# Patient Record
Sex: Female | Born: 1965 | Race: Black or African American | Hispanic: No | Marital: Married | Smoking: Never smoker
Health system: Southern US, Community
[De-identification: ages and names within clinical notes are randomized; demographics above are authoritative.]

## PROBLEM LIST (undated history)

## (undated) DIAGNOSIS — O021 Missed abortion: Secondary | ICD-10-CM

## (undated) DIAGNOSIS — Z8619 Personal history of other infectious and parasitic diseases: Secondary | ICD-10-CM

## (undated) HISTORY — PX: CHOLECYSTECTOMY: SHX55

## (undated) HISTORY — DX: Personal history of other infectious and parasitic diseases: Z86.19

## (undated) HISTORY — PX: CIRCUMCISION: SUR203

## (undated) HISTORY — DX: Missed abortion: O02.1

## (undated) HISTORY — PX: DILATION AND CURETTAGE OF UTERUS: SHX78

---

## 2009-03-16 DIAGNOSIS — Z8619 Personal history of other infectious and parasitic diseases: Secondary | ICD-10-CM

## 2009-03-16 HISTORY — DX: Personal history of other infectious and parasitic diseases: Z86.19

## 2009-06-21 ENCOUNTER — Emergency Department (HOSPITAL_BASED_OUTPATIENT_CLINIC_OR_DEPARTMENT_OTHER): Admission: EM | Admit: 2009-06-21 | Discharge: 2009-06-21 | Payer: Self-pay | Admitting: Emergency Medicine

## 2010-03-26 ENCOUNTER — Ambulatory Visit (HOSPITAL_COMMUNITY): Admission: RE | Admit: 2010-03-26 | Payer: 59 | Source: Home / Self Care | Admitting: Obstetrics and Gynecology

## 2010-04-16 ENCOUNTER — Ambulatory Visit (HOSPITAL_COMMUNITY)
Admission: RE | Admit: 2010-04-16 | Discharge: 2010-04-16 | Disposition: A | Discharge status: Home / Self Care | Payer: 59 | Source: Ambulatory Visit | Attending: Obstetrics and Gynecology | Admitting: Obstetrics and Gynecology

## 2010-04-16 ENCOUNTER — Other Ambulatory Visit: Payer: Self-pay | Admitting: Obstetrics and Gynecology

## 2010-04-16 DIAGNOSIS — N938 Other specified abnormal uterine and vaginal bleeding: Secondary | ICD-10-CM | POA: Insufficient documentation

## 2010-04-16 DIAGNOSIS — N949 Unspecified condition associated with female genital organs and menstrual cycle: Secondary | ICD-10-CM | POA: Insufficient documentation

## 2010-04-16 DIAGNOSIS — N840 Polyp of corpus uteri: Secondary | ICD-10-CM | POA: Insufficient documentation

## 2010-04-16 HISTORY — PX: HYSTEROSCOPY: SHX211

## 2010-04-16 LAB — CBC
Hemoglobin: 13.1 g/dL (ref 12.0–15.0)
MCH: 26.6 pg (ref 26.0–34.0)
MCHC: 32.3 g/dL (ref 30.0–36.0)
MCV: 82.5 fL (ref 78.0–100.0)
RBC: 4.92 MIL/uL (ref 3.87–5.11)

## 2010-04-29 NOTE — Op Note (Signed)
  NAMEDALARY, Veronica Morse            ACCOUNT NO.:  [id number]  MEDICAL RECORD NO.:  [id number]           PATIENT TYPE:  O  LOCATION:  [facility]                          [facility]  PHYSICIAN:  IT trainer, M.D.DATE OF BIRTH:  26-Sep-1965  DATE OF PROCEDURE:  04/16/2010 DATE OF DISCHARGE:                              OPERATIVE REPORT   PREOPERATIVE DIAGNOSES:  Dysfunctional uterine bleeding, endometrial mass,  endometrial thickness.  PROCEDURE: Diagnostic hysteroscopy, hysteroscopic resection of endometrial polyps, D and C.  POSTOPERATIVE DIAGNOSES:  Dysfunctional uterine bleeding and endometrial polyps.  ANESTHESIA:  General, paracervical block.  SURGEON:  Maxie Better, MD  ASSISTANT:  None.  PROCEDURE IN DETAIL:  Under adequate general anesthesia, the patient was placed in the dorsal lithotomy position.  She was sterilely prepped and draped in usual fashion.  The bladder was catheterized for moderate amount of urine.  Examination under anesthesia revealed anteverted uterus.  No adnexal masses could be appreciated.  A bivalve speculum placed in the vagina.  A 10 mL of 1% Nesacaine was injected paracervically at 3 and 9 o'clock position.  The anterior lip of the cervix was grasped with a single-tooth tenaculum.  Cervix serially dilated to #23 [facility] dilator.  A hysteroscope was introduced in the uterine cavity.  Both tubal ostia could be seen, left greater than right.  Multiple polypoid lesions were noted.  The hysteroscope was removed.  The polyp forceps was then introduced to attempt to remove the polyps in that fashion which was unsuccessful.  At that point, the cervix was further dilated and a resectoscope with a single loop was then placed.  The polypoid lesions were resected.  The resectoscope removed. Cavity was curetted.  Resectoscope inserted.  When all polypoid lesions was felt to be removed, the resectoscope was then removed.  The cavity was then  curetted.  All instruments then removed from the vagina. Specimen labeled endometrial curetting with presence of polyps was sent to Pathology.  Estimated blood loss was minimal.  Fluid deficit 100 mL. Sponge and instrument counts x2 was correct.  Complications none.  The patient tolerated the procedure well and was transferred to recovery in stable condition.     Maxie Better, M.D.     [place]/MEDQ  D:  04/16/2010  T:  04/17/2010  Job:  [id number]  Electronically Signed by Nena Jordan Bora Broner M.D. on 04/28/2010 02:14:55 AM

## 2010-05-11 LAB — COMPREHENSIVE METABOLIC PANEL
ALT: 348 U/L — ABNORMAL HIGH (ref 0–35)
AST: 715 U/L — ABNORMAL HIGH (ref 0–37)
Albumin: 4 g/dL (ref 3.5–5.2)
CO2: 25 mEq/L (ref 19–32)
Chloride: 107 mEq/L (ref 96–112)
Creatinine, Ser: 0.5 mg/dL (ref 0.4–1.2)
GFR calc Af Amer: >60 mL/min (ref >60)
GFR calc non Af Amer: >60 mL/min (ref >60)
Glucose, Bld: 140 mg/dL — ABNORMAL HIGH (ref 70–99)
Potassium: 4.1 mEq/L (ref 3.5–5.1)
Sodium: 140 mEq/L (ref 135–145)
Total Bilirubin: 0.8 mg/dL (ref 0.3–1.2)

## 2010-05-11 LAB — CBC
Hemoglobin: 13.4 g/dL (ref 12.0–15.0)
MCHC: 33 g/dL (ref 30.0–36.0)
MCV: 81.4 fL (ref 78.0–100.0)
Platelets: 193 10*3/uL (ref 150–400)
WBC: 12.8 10*3/uL — ABNORMAL HIGH (ref 4.0–10.5)

## 2010-05-11 LAB — URINE CULTURE

## 2010-05-11 LAB — URINALYSIS, ROUTINE W REFLEX MICROSCOPIC
Bilirubin Urine: NEGATIVE
Glucose, UA: NEGATIVE mg/dL
Specific Gravity, Urine: 1.025 (ref 1.005–1.030)
pH: 5.5 (ref 5.0–8.0)

## 2010-05-11 LAB — URINE MICROSCOPIC-ADD ON

## 2010-05-11 LAB — HEPATITIS PANEL, ACUTE: Hepatitis B Surface Ag: NEGATIVE

## 2010-05-13 ENCOUNTER — Other Ambulatory Visit: Payer: Self-pay | Admitting: Obstetrics and Gynecology

## 2010-06-29 ENCOUNTER — Other Ambulatory Visit: Payer: Self-pay | Admitting: Obstetrics and Gynecology

## 2010-07-08 ENCOUNTER — Other Ambulatory Visit: Payer: Self-pay | Admitting: Obstetrics and Gynecology

## 2011-01-04 ENCOUNTER — Other Ambulatory Visit (HOSPITAL_COMMUNITY): Payer: Self-pay | Admitting: Obstetrics and Gynecology

## 2011-01-04 DIAGNOSIS — N979 Female infertility, unspecified: Secondary | ICD-10-CM

## 2011-01-10 ENCOUNTER — Ambulatory Visit (HOSPITAL_COMMUNITY): Payer: 59

## 2011-01-11 ENCOUNTER — Ambulatory Visit (HOSPITAL_COMMUNITY)
Admission: RE | Admit: 2011-01-11 | Discharge: 2011-01-11 | Disposition: A | Discharge status: Home / Self Care | Payer: 59 | Source: Ambulatory Visit | Attending: Obstetrics and Gynecology | Admitting: Obstetrics and Gynecology

## 2011-01-11 DIAGNOSIS — N979 Female infertility, unspecified: Secondary | ICD-10-CM

## 2011-01-11 MED ORDER — IOHEXOL 300 MG/ML  SOLN: 7.0000 mL | Freq: Once | INTRAMUSCULAR | Status: AC | PRN

## 2011-10-03 ENCOUNTER — Ambulatory Visit (INDEPENDENT_AMBULATORY_CARE_PROVIDER_SITE_OTHER): Payer: 59 | Admitting: Gynecology

## 2011-10-03 ENCOUNTER — Encounter: Payer: Self-pay | Admitting: Gynecology

## 2011-10-03 VITALS — BP 120/84 | Ht 60.25 in | Wt 152.0 lb

## 2011-10-03 DIAGNOSIS — Z8742 Personal history of other diseases of the female genital tract: Secondary | ICD-10-CM

## 2011-10-03 DIAGNOSIS — N898 Other specified noninflammatory disorders of vagina: Secondary | ICD-10-CM

## 2011-10-03 DIAGNOSIS — N90810 Female genital mutilation status, unspecified: Secondary | ICD-10-CM

## 2011-10-03 DIAGNOSIS — Z8759 Personal history of other complications of pregnancy, childbirth and the puerperium: Secondary | ICD-10-CM

## 2011-10-03 DIAGNOSIS — Z113 Encounter for screening for infections with a predominantly sexual mode of transmission: Secondary | ICD-10-CM

## 2011-10-03 DIAGNOSIS — N760 Acute vaginitis: Secondary | ICD-10-CM

## 2011-10-03 DIAGNOSIS — A499 Bacterial infection, unspecified: Secondary | ICD-10-CM

## 2011-10-03 LAB — WET PREP FOR TRICH, YEAST, CLUE

## 2011-10-03 MED ORDER — METRONIDAZOLE 500 MG PO TABS: 500.0000 mg | ORAL_TABLET | Freq: Two times a day (BID) | ORAL | Status: AC

## 2011-10-03 NOTE — Progress Notes (Signed)
A 46 year old gravida 1 para 0 AB 1 who is from [place] who presented to the office today as a new patient. She did bring notes from her previous provider at  [facility] to help decipher the reason for her visit today. Patient had a missed AB and was recently treated with Cytotec 200 mcg x4 which she took her last dose on July 27. She is complaining of a vaginal discharge with odor and complaining of some bilateral lower  twinges in her lower abdomen. She had several quantitative beta-hCGs that I reviewed and the last one that was on August 2 had a value of 462. I did not see any documentation as to her blood type and Rh. Further review of her record indicated she had a normal Pap smear in January 2012 and prior to that she had regular cycles and had issues with infertility and had an HSG which had demonstrated bilateral tubal patency and also had a normal TSH and AMH levels which were normal. She did have history of a positive Chlamydia infection back in May of 2012. She also had the same year colposcopy secondary to post coital bleeding had several cervical biopsies which are reported to be normal. Also she had been 3 years on Crestor for hyperlipidemia and has been off of it for a year now. Patient with prior history of female circumcision.  Exam: Abdomen: Soft nontender no rebound or guarding Pelvic: Bartholin urethra Skene was within normal limits Vagina: Vaginal discharge with odor brownish in appearance Cervix: No gross lesions on inspection Bimanual exam: Uterus slightly anteverted upper limits of normal no rebound or guarding at Adnexa: No palpable masses or tenderness Rectal exam: Not done.  Assessment: Wet prep with evidence of bacterial vaginosis (positiveAmine, and moderate amount of tissue cells, and moderate amount of white blood cells, and to numerous to count bacteria)  Assessment/plan: Patient with apparent missed AB last month treated with Cytotec complaining of vaginal discharge  with odor dilution appearance. She will be treated for BV with Flagyl 500 mg twice a day for 5 days. We will draw quantitative beta-hCGs as well as a blood type and Rh today. She will return back to the office next week for an ultrasound to reassure that her intrauterine cavity had been completely evacuated and also for better assessment of her adnexa as a result of the twinges that she has been complaining of as well. A GC and Chlamydia culture was done today results pending at time of this dictation. Her has and was present all the above was discussed in detail all questions rancher will follow accordingly. We may need to have her have a fasting lipid profile in the future as well and is to sit down to discuss further her issues with infertility that she had before this pregnancy.

## 2011-10-03 NOTE — Patient Instructions (Signed)
Bacterial Vaginosis Bacterial vaginosis (BV) is a vaginal infection where the normal balance of bacteria in the vagina is disrupted. The normal balance is then replaced by an overgrowth of certain bacteria. There are several different kinds of bacteria that can cause BV. BV is the most common vaginal infection in women of childbearing age. CAUSES   The cause of BV is not fully understood. BV develops when there is an increase or imbalance of harmful bacteria.   Some activities or behaviors can upset the normal balance of bacteria in the vagina and put women at increased risk including:   Having a new sex partner or multiple sex partners.   Douching.   Using an intrauterine device (IUD) for contraception.   It is not clear what role sexual activity plays in the development of BV. However, women that have never had sexual intercourse are rarely infected with BV.  Women do not get BV from toilet seats, bedding, swimming pools or from touching objects around them.  SYMPTOMS   Grey vaginal discharge.   A fish-like odor with discharge, especially after sexual intercourse.   Itching or burning of the vagina and vulva.   Burning or pain with urination.   Some women have no signs or symptoms at all.  DIAGNOSIS  Your caregiver must examine the vagina for signs of BV. Your caregiver will perform lab tests and look at the sample of vaginal fluid through a microscope. They will look for bacteria and abnormal cells (clue cells), a pH test higher than 4.5, and a positive amine test all associated with BV.  RISKS AND COMPLICATIONS   Pelvic inflammatory disease (PID).   Infections following gynecology surgery.   Developing HIV.   Developing herpes virus.  TREATMENT  Sometimes BV will clear up without treatment. However, all women with symptoms of BV should be treated to avoid complications, especially if gynecology surgery is planned. Female partners generally do not need to be treated. However,  BV may spread between female sex partners so treatment is helpful in preventing a recurrence of BV.   BV may be treated with antibiotics. The antibiotics come in either pill or vaginal cream forms. Either can be used with nonpregnant or pregnant women, but the recommended dosages differ. These antibiotics are not harmful to the baby.   BV can recur after treatment. If this happens, a second round of antibiotics will often be prescribed.   Treatment is important for pregnant women. If not treated, BV can cause a premature delivery, especially for a pregnant woman who had a premature birth in the past. All pregnant women who have symptoms of BV should be checked and treated.   For chronic reoccurrence of BV, treatment with a type of prescribed gel vaginally twice a week is helpful.  HOME CARE INSTRUCTIONS   Finish all medication as directed by your caregiver.   Do not have sex until treatment is completed.   Tell your sexual partner that you have a vaginal infection. They should see their caregiver and be treated if they have problems, such as a mild rash or itching.   Practice safe sex. Use condoms. Only have 1 sex partner.  PREVENTION  Basic prevention steps can help reduce the risk of upsetting the natural balance of bacteria in the vagina and developing BV:  Do not have sexual intercourse (be abstinent).   Do not douche.   Use all of the medicine prescribed for treatment of BV, even if the signs and symptoms go away.     Tell your sex partner if you have BV. That way, they can be treated, if needed, to prevent reoccurrence.  SEEK MEDICAL CARE IF:   Your symptoms are not improving after 3 days of treatment.   You have increased discharge, pain, or fever.  MAKE SURE YOU:   Understand these instructions.   Will watch your condition.   Will get help right away if you are not doing well or get worse.  FOR MORE INFORMATION  Division of STD Prevention (DSTDP), Centers for Disease  Control and Prevention: www.cdc.gov/std American Social Health Association (ASHA): www.ashastd.org  Document Released: 02/07/2005 Document Revised: 01/27/2011 Document Reviewed: 07/31/2008 ExitCare Patient Information 2012 [employer]. 

## 2011-10-04 LAB — GC/CHLAMYDIA PROBE AMP, GENITAL: GC Probe Amp, Genital: NEGATIVE

## 2011-10-04 LAB — ABO AND RH

## 2011-10-13 ENCOUNTER — Ambulatory Visit (INDEPENDENT_AMBULATORY_CARE_PROVIDER_SITE_OTHER): Payer: 59

## 2011-10-13 ENCOUNTER — Ambulatory Visit (INDEPENDENT_AMBULATORY_CARE_PROVIDER_SITE_OTHER): Payer: 59 | Admitting: Gynecology

## 2011-10-13 ENCOUNTER — Encounter: Payer: Self-pay | Admitting: Gynecology

## 2011-10-13 DIAGNOSIS — D259 Leiomyoma of uterus, unspecified: Secondary | ICD-10-CM

## 2011-10-13 DIAGNOSIS — Z8742 Personal history of other diseases of the female genital tract: Secondary | ICD-10-CM

## 2011-10-13 DIAGNOSIS — D252 Subserosal leiomyoma of uterus: Secondary | ICD-10-CM

## 2011-10-13 DIAGNOSIS — N839 Noninflammatory disorder of ovary, fallopian tube and broad ligament, unspecified: Secondary | ICD-10-CM

## 2011-10-13 DIAGNOSIS — D251 Intramural leiomyoma of uterus: Secondary | ICD-10-CM

## 2011-10-13 DIAGNOSIS — N83209 Unspecified ovarian cyst, unspecified side: Secondary | ICD-10-CM

## 2011-10-13 DIAGNOSIS — N949 Unspecified condition associated with female genital organs and menstrual cycle: Secondary | ICD-10-CM

## 2011-10-13 DIAGNOSIS — O021 Missed abortion: Secondary | ICD-10-CM

## 2011-10-13 DIAGNOSIS — O26899 Other specified pregnancy related conditions, unspecified trimester: Secondary | ICD-10-CM

## 2011-10-13 DIAGNOSIS — Z8759 Personal history of other complications of pregnancy, childbirth and the puerperium: Secondary | ICD-10-CM

## 2011-10-13 NOTE — Patient Instructions (Addendum)
Ovarian Cyst  The ovaries are small organs that are on each side of the uterus. The ovaries are the organs that produce the female hormones, estrogen and progesterone. An ovarian cyst is a sac filled with fluid that can vary in its size. It is normal for a small cyst to form in women who are in the childbearing age and who have menstrual periods. This type of cyst is called a follicle cyst that becomes an ovulation cyst (corpus luteum cyst) after it produces the women's egg. It later goes away on its own if the woman does not become pregnant. There are other kinds of ovarian cysts that may cause problems and may need to be treated. The most serious problem is a cyst with cancer. It should be noted that menopausal women who have an ovarian cyst are at a higher risk of it being a cancer cyst. They should be evaluated very quickly, thoroughly and followed closely. This is especially true in menopausal women because of the high rate of ovarian cancer in women in menopause.  CAUSES AND TYPES OF OVARIAN CYSTS:   FUNCTIONAL CYST: The follicle/corpus luteum cyst is a functional cyst that occurs every month during ovulation with the menstrual cycle. They go away with the next menstrual cycle if the woman does not get pregnant. Usually, there are no symptoms with a functional cyst.   ENDOMETRIOMA CYST: This cyst develops from the lining of the uterus tissue. This cyst gets in or on the ovary. It grows every month from the bleeding during the menstrual period. It is also called a "chocolate cyst" because it becomes filled with blood that turns brown. This cyst can cause pain in the lower abdomen during intercourse and with your menstrual period.   CYSTADENOMA CYST: This cyst develops from the cells on the outside of the ovary. They usually are not cancerous. They can get very big and cause lower abdomen pain and pain with intercourse. This type of cyst can twist on itself, cut off its blood supply and cause severe pain. It  also can easily rupture and cause a lot of pain.   DERMOID CYST: This type of cyst is sometimes found in both ovaries. They are found to have different kinds of body tissue in the cyst. The tissue includes skin, teeth, hair, and/or cartilage. They usually do not have symptoms unless they get very big. Dermoid cysts are rarely cancerous.   POLYCYSTIC OVARY: This is a rare condition with hormone problems that produces many small cysts on both ovaries. The cysts are follicle-like cysts that never produce an egg and become a corpus luteum. It can cause an increase in body weight, infertility, acne, increase in body and facial hair and lack of menstrual periods or rare menstrual periods. Many women with this problem develop type 2 diabetes. The exact cause of this problem is unknown. A polycystic ovary is rarely cancerous.   THECA LUTEIN CYST: Occurs when too much hormone (human chorionic gonadotropin) is produced and over-stimulates the ovaries to produce an egg. They are frequently seen when doctors stimulate the ovaries for invitro-fertilization (test tube babies).   LUTEOMA CYST: This cyst is seen during pregnancy. Rarely it can cause an obstruction to the birth canal during labor and delivery. They usually go away after delivery.  SYMPTOMS    Pelvic pain or pressure.   Pain during sexual intercourse.   Increasing girth (swelling) of the abdomen.   Abnormal menstrual periods.   Increasing pain with menstrual periods.     You stop having menstrual periods and you are not pregnant.  DIAGNOSIS   The diagnosis can be made during:   Routine or annual pelvic examination (common).   Ultrasound.   X-ray of the pelvis.   CT Scan.   MRI.   Blood tests.  TREATMENT    Treatment may only be to follow the cyst monthly for 2 to 3 months with your caregiver. Many go away on their own, especially functional cysts.   May be aspirated (drained) with a long needle with ultrasound, or by laparoscopy (inserting a tube into  the pelvis through a small incision).   The whole cyst can be removed by laparoscopy.   Sometimes the cyst may need to be removed through an incision in the lower abdomen.   Hormone treatment is sometimes used to help dissolve certain cysts.   Birth control pills are sometimes used to help dissolve certain cysts.  HOME CARE INSTRUCTIONS   Follow your caregiver's advice regarding:   Medicine.   Follow up visits to evaluate and treat the cyst.   You may need to come back or make an appointment with another caregiver, to find the exact cause of your cyst, if your caregiver is not a gynecologist.   Get your yearly and recommended pelvic examinations and Pap tests.   Let your caregiver know if you have had an ovarian cyst in the past.  SEEK MEDICAL CARE IF:    Your periods are late, irregular, they stop, or are painful.   Your stomach (abdomen) or pelvic pain does not go away.   Your stomach becomes larger or swollen.   You have pressure on your bladder or trouble emptying your bladder completely.   You have painful sexual intercourse.   You have feelings of fullness, pressure, or discomfort in your stomach.   You lose weight for no apparent reason.   You feel generally ill.   You become constipated.   You lose your appetite.   You develop acne.   You have an increase in body and facial hair.   You are gaining weight, without changing your exercise and eating habits.   You think you are pregnant.  SEEK IMMEDIATE MEDICAL CARE IF:    You have increasing abdominal pain.   You feel sick to your stomach (nausea) and/or vomit.   You develop a fever that comes on suddenly.   You develop abdominal pain during a bowel movement.   Your menstrual periods become heavier than usual.  Document Released: 02/07/2005 Document Revised: 01/27/2011 Document Reviewed: 12/11/2008  ExitCare Patient Information 2012 [employer].    Fibroids  You have been diagnosed as having a fibroid. Fibroids are smooth muscle  lumps (tumors) which can occur any place in a woman's body. They are usually in the womb (uterus). The most common problem (symptom) of fibroids is bleeding. Over time this may cause low red blood cells (anemia). Other symptoms include feelings of pressure and pain in the pelvis. The diagnosis (learning what is wrong) of fibroids is made by physical exam. Sometimes tests such as an ultrasound are used. This is helpful when fibroids are felt around the ovaries and to look for tumors.  TREATMENT    Most fibroids do not need surgical or medical treatment. Sometimes a tissue sample (biopsy) of the lining of the uterus is done to rule out cancer. If there is no cancer and only a small amount of bleeding, the problem can be watched.   Hormonal treatment can   improve the problem.   When surgery is needed, it can consist of removing the fibroid. Vaginal birth may not be possible after the removal of fibroids. This depends on where they are and the extent of surgery. When pregnancy occurs with fibroids it is usually normal.   Your caregiver can help decide which treatments are best for you.  HOME CARE INSTRUCTIONS    Do not use aspirin as this may increase bleeding problems.   If your periods (menses) are heavy, record the number of pads or tampons used per month. Bring this information to your caregiver. This can help them determine the best treatment for you.  SEEK IMMEDIATE MEDICAL CARE IF:   You have pelvic pain or cramps not controlled with medications, or experience a sudden increase in pain.   You have an increase of pelvic bleeding between and during menses.   You feel lightheaded or have fainting spells.   You develop worsening belly (abdominal) pain.  Document Released: 02/05/2000 Document Revised: 01/27/2011 Document Reviewed: 09/27/2007  ExitCare Patient Information 2012 [employer].

## 2011-10-13 NOTE — Progress Notes (Signed)
A 46 year old gravida 1 para 0 AB 1 who is from [place] who was seen in the office last week as a new patient. She did bring notes from her previous provider at [facility] to help decipher the reason for her visit today. Patient had a missed AB and was recently treated with Cytotec 200 mcg x4 which she took her last dose on July 27. She is complaining of a vaginal discharge with odor and complaining of some bilateral lower twinges in her lower abdomen. She had several quantitative beta-hCGs that I reviewed and the last one that was on August 2 had a value of 462. I did not see any documentation as to her blood type and Rh. Further review of her record indicated she had a normal Pap smear in January 2012 and prior to that she had regular cycles and had issues with infertility and had an HSG which had demonstrated bilateral tubal patency and also had a normal TSH and AMH levels which were normal. She did have history of a positive Chlamydia infection back in May of 2012. She also had the same year colposcopy secondary to post coital bleeding had several cervical biopsies which are reported to be normal. Also she had been 3 years on Crestor for hyperlipidemia and has been off of it for a year now. Patient with prior history of female circumcision.    Patient with apparent missed AB last month treated with Cytotec. Patient was asked to return to the office today for a blood type and Rh as well as an ultrasound. Her blood type is B+ her recent GC and Chlamydia culture were negative. On August 2 her quantitative beta-hCG was 462. She had been having some lower abdominal twinges and now is the reason for an ultrasound today as well as to make sure that there was no retained products of conception.  Ultrasound today: Uterus measured 9.2 x 6.4 x 5.1 cm endometrial stripe of 14.1 mm. Right intramural and subserous fibroids measuring 23 x 22 mm, 26 x 26 mm. Prominent endometrial cavity unable to identify any retained  products of conception. A right ovarian thinwall cyst was noted and measured 4.7 x 4.6 x 4.3 cm reticular echo pattern and avascular left ovary was normal. Minimal fluid in the cul-de-sac.  Assessment/plan: Patient status post missed AB recently treated with Cytotec. We'll check quantitative beta-hCG to monitored trend. Patient will be instructed to use barrier contraception for the next 3 months. She will return back in 3 months for followup ultrasound to ascertain complete resolution of this ovarian cyst. Will notify with the results of the quantitative beta-hCG and manage accordingly.

## 2011-10-14 ENCOUNTER — Other Ambulatory Visit: Payer: Self-pay | Admitting: Gynecology

## 2011-10-14 DIAGNOSIS — O021 Missed abortion: Secondary | ICD-10-CM

## 2011-10-18 ENCOUNTER — Other Ambulatory Visit: Payer: 59

## 2011-10-20 ENCOUNTER — Other Ambulatory Visit: Payer: 59

## 2011-10-20 DIAGNOSIS — O021 Missed abortion: Secondary | ICD-10-CM

## 2011-10-21 ENCOUNTER — Other Ambulatory Visit: Payer: Self-pay | Admitting: Gynecology

## 2011-10-21 DIAGNOSIS — O021 Missed abortion: Secondary | ICD-10-CM

## 2011-10-21 DIAGNOSIS — N83209 Unspecified ovarian cyst, unspecified side: Secondary | ICD-10-CM

## 2011-10-31 ENCOUNTER — Other Ambulatory Visit: Payer: 59

## 2011-10-31 DIAGNOSIS — O021 Missed abortion: Secondary | ICD-10-CM

## 2012-01-12 ENCOUNTER — Ambulatory Visit (INDEPENDENT_AMBULATORY_CARE_PROVIDER_SITE_OTHER): Payer: 59

## 2012-01-12 ENCOUNTER — Ambulatory Visit (INDEPENDENT_AMBULATORY_CARE_PROVIDER_SITE_OTHER): Payer: 59 | Admitting: Gynecology

## 2012-01-12 ENCOUNTER — Encounter: Payer: Self-pay | Admitting: Gynecology

## 2012-01-12 VITALS — BP 126/80

## 2012-01-12 DIAGNOSIS — N83209 Unspecified ovarian cyst, unspecified side: Secondary | ICD-10-CM

## 2012-01-12 DIAGNOSIS — N949 Unspecified condition associated with female genital organs and menstrual cycle: Secondary | ICD-10-CM

## 2012-01-12 DIAGNOSIS — R102 Pelvic and perineal pain: Secondary | ICD-10-CM

## 2012-01-12 DIAGNOSIS — D252 Subserosal leiomyoma of uterus: Secondary | ICD-10-CM

## 2012-01-12 DIAGNOSIS — D251 Intramural leiomyoma of uterus: Secondary | ICD-10-CM

## 2012-01-12 DIAGNOSIS — N831 Corpus luteum cyst of ovary, unspecified side: Secondary | ICD-10-CM

## 2012-01-12 DIAGNOSIS — D259 Leiomyoma of uterus, unspecified: Secondary | ICD-10-CM

## 2012-01-12 DIAGNOSIS — N83201 Unspecified ovarian cyst, right side: Secondary | ICD-10-CM

## 2012-01-12 NOTE — Progress Notes (Signed)
Patient presented to the office today for an ultrasound to followup on a right ovarian cyst seen on ultrasound August 2013 see previous note dated August 22 for detail past medical history.  Patient asymptomatic today. Ultrasound demonstrated a uterus measured 9.5 x 6.5 x 5.3 cm endometrial stripe of 12.6 mm (patient currently on day 20 of her cycle). Patient with subserosal fibroma measuring 29 x 23 mm and a second one measuring 21 x 21 mm. Right ovary was normal. Previous right ovarian cyst not seen. Left ovary thick wall cyst measuring 25 x 24 x 27 mm with positive color flow in the periphery consistent with a corpus luteum cyst. Moderate amount of fluid was seen in the cul-de-sac.  Patient had a miscarriage in July treated with Cytotec and spontaneously aborted. Quantitative beta-hCGs were followed down to 0. See previous note. Patient interested in getting pregnant. She has been using barrier contraception. We discussed the utilization of ovulation predictor kit to time her intercourse. I recommend she start prenatal vitamins. She will return back to the office in March of next year for her complete annual gynecological examination and ultrasound for followup on the small left ovarian cyst.

## 2012-01-27 ENCOUNTER — Other Ambulatory Visit: Payer: 59

## 2012-01-27 ENCOUNTER — Ambulatory Visit: Payer: 59 | Admitting: Gynecology

## 2012-05-18 ENCOUNTER — Encounter: Payer: 59 | Admitting: Gynecology

## 2012-05-18 ENCOUNTER — Other Ambulatory Visit: Payer: 59

## 2012-05-23 ENCOUNTER — Other Ambulatory Visit: Payer: Self-pay | Admitting: Gynecology

## 2012-05-23 DIAGNOSIS — N83202 Unspecified ovarian cyst, left side: Secondary | ICD-10-CM

## 2012-05-25 ENCOUNTER — Ambulatory Visit (INDEPENDENT_AMBULATORY_CARE_PROVIDER_SITE_OTHER): Payer: 59 | Admitting: Gynecology

## 2012-05-25 ENCOUNTER — Ambulatory Visit (INDEPENDENT_AMBULATORY_CARE_PROVIDER_SITE_OTHER): Payer: 59

## 2012-05-25 DIAGNOSIS — D219 Benign neoplasm of connective and other soft tissue, unspecified: Secondary | ICD-10-CM

## 2012-05-25 DIAGNOSIS — D259 Leiomyoma of uterus, unspecified: Secondary | ICD-10-CM

## 2012-05-25 DIAGNOSIS — N83209 Unspecified ovarian cyst, unspecified side: Secondary | ICD-10-CM

## 2012-05-25 DIAGNOSIS — N83202 Unspecified ovarian cyst, left side: Secondary | ICD-10-CM

## 2012-05-25 NOTE — Patient Instructions (Addendum)

## 2012-05-25 NOTE — Progress Notes (Signed)
Patient presented today for followup ultrasound for followup of a previously seen left ovarian thick wall cyst that measured 25 x 24 x 27 mm on 01/12/2012. Review records as follows:  Patient had a miscarriage in July treated with Cytotec and spontaneously aborted. Quantitative beta-hCGs were followed down to 0. See previous note. Patient interested in getting pregnant.   She's currently not using any form of contraception it was recommended before that she start prenatal vitamins. She's having normal menstrual cycles and is asymptomatic today.  Ultrasound today: Uterus measured 10 x 7.1 x 5.6 cm with endometrial stripe 11.5 mm, patient with 3 small fibroids the largest one measured 20 x 25 x 21 mm. Both ovaries appeared to be normal.  Assessment/plan: Patient was reassured that the previously seen ovarian cyst has resolved. We discussed once again and gave her written explanation on the timing of intercourse by using the ovulation predictor kit. She was once again given prescription for prenatal vitamins. She will return back next month and she is due for her annual exam.

## 2012-06-29 ENCOUNTER — Encounter: Payer: 59 | Admitting: Gynecology

## 2013-05-29 ENCOUNTER — Encounter: Payer: 59 | Admitting: Gynecology

## 2013-10-30 ENCOUNTER — Encounter: Payer: Self-pay | Admitting: Women's Health

## 2013-10-30 ENCOUNTER — Ambulatory Visit (INDEPENDENT_AMBULATORY_CARE_PROVIDER_SITE_OTHER): Payer: 59 | Admitting: Women's Health

## 2013-10-30 DIAGNOSIS — A499 Bacterial infection, unspecified: Secondary | ICD-10-CM

## 2013-10-30 DIAGNOSIS — N926 Irregular menstruation, unspecified: Secondary | ICD-10-CM

## 2013-10-30 DIAGNOSIS — N760 Acute vaginitis: Secondary | ICD-10-CM

## 2013-10-30 DIAGNOSIS — B9689 Other specified bacterial agents as the cause of diseases classified elsewhere: Secondary | ICD-10-CM

## 2013-10-30 DIAGNOSIS — N979 Female infertility, unspecified: Secondary | ICD-10-CM | POA: Insufficient documentation

## 2013-10-30 LAB — WET PREP FOR TRICH, YEAST, CLUE
Trich, Wet Prep: NONE SEEN
Yeast Wet Prep HPF POC: NONE SEEN

## 2013-10-30 LAB — TSH: TSH: 2.565 u[IU]/mL (ref 0.350–4.500)

## 2013-10-30 MED ORDER — METRONIDAZOLE 0.75 % VA GEL: VAGINAL | Status: AC

## 2013-10-30 NOTE — Patient Instructions (Signed)
Bacterial Vaginosis Bacterial vaginosis is an infection of the vagina. It happens when too many of certain germs (bacteria) grow in the vagina. HOME CARE  Take your medicine as told by your doctor.  Finish your medicine even if you start to feel better.  Do not have sex until you finish your medicine and are better.  Tell your sex partner that you have an infection. They should see their doctor for treatment.  Practice safe sex. Use condoms. Have only one sex partner. GET HELP IF:  You are not getting better after 3 days of treatment.  You have more grey fluid (discharge) coming from your vagina than before.  You have more pain than before.  You have a fever. MAKE SURE YOU:   Understand these instructions.  Will watch your condition.  Will get help right away if you are not doing well or get worse. Document Released: 11/17/2007 Document Revised: 11/28/2012 Document Reviewed: 09/19/2012 ExitCare Patient Information 2015 [employer]. This information is not intended to replace advice given to you by your health care provider. Make sure you discuss any questions you have with your health care provider.  

## 2013-10-30 NOTE — Progress Notes (Signed)
Patient ID: Veronica Morse, female   DOB: 10/03/1965, 48 y.o.   MRN: [id number] Presents with several issues. Reports having irregular normal monthly cycle that has had bleeding after intercourse the past 3-4 months, will have no bleeding for several days and then will have bright red bleeding. Has had some intermittent left lower quadrant discomfort. Denies urinary symptoms, fever. Desires pregnancy, has had one SAB 08/2011. Has been married greater than 10 years with no contraception. Husband has had a semen analysis that showed minimally low motility and count. Originally from [place], [place] requested several labs which we will check today. Negative GC/Chlamydia 2013.  Exam: Appears well. 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 Bimanual no CMT or adnexal fullness or tenderness.  Bacteria vaginosis DUB  Plan: MetroGel vaginal cream 1 applicator at bedtime x5, alcohol for cautions reviewed. If continued irregular bleeding instructed to return to office. TSH, prolactin. Requested labs CMV, toxoplasmosis, cardiolipins,  Lupus, RPR. Declines referral to infertility. Instructed to schedule appointment Dr. Toney Rakes to remove large skin tag on left labia.

## 2013-10-31 LAB — PROLACTIN: PROLACTIN: 8.1 ng/mL

## 2013-10-31 LAB — CARDIOLIPIN ANTIBODIES, IGG, IGM, IGA
ANTICARDIOLIPIN IGA: 8 U/mL (ref <22)
ANTICARDIOLIPIN IGG: 10 GPL U/mL (ref <23)
ANTICARDIOLIPIN IGM: 4 [MPL'U]/mL (ref <11)

## 2013-10-31 LAB — CMV IGM: CMV IGM: 10.6 [AU]/ml (ref <30.00)

## 2013-10-31 LAB — CYTOMEGALOVIRUS ANTIBODY, IGG: Cytomegalovirus Ab-IgG: 8 U/mL — ABNORMAL HIGH (ref <0.60)

## 2013-10-31 LAB — TOXOPLASMA GONDII ANTIBODY, IGM

## 2013-10-31 LAB — RPR

## 2013-11-01 LAB — LUPUS ANTICOAGULANT PANEL
DRVVT: 32.7 secs (ref <42.9)
LUPUS ANTICOAGULANT: NOT DETECTED
PTT Lupus Anticoagulant: 31.6 secs (ref 28.0–43.0)

## 2013-12-23 ENCOUNTER — Encounter: Payer: Self-pay | Admitting: Women's Health

## 2013-12-23 ENCOUNTER — Encounter: Payer: 59 | Admitting: Gynecology

## 2013-12-25 ENCOUNTER — Other Ambulatory Visit: Payer: Self-pay

## 2013-12-25 ENCOUNTER — Encounter: Payer: Self-pay | Admitting: Gynecology

## 2013-12-25 ENCOUNTER — Ambulatory Visit (INDEPENDENT_AMBULATORY_CARE_PROVIDER_SITE_OTHER): Payer: 59 | Admitting: Gynecology

## 2013-12-25 ENCOUNTER — Other Ambulatory Visit (HOSPITAL_COMMUNITY)
Admission: RE | Admit: 2013-12-25 | Discharge: 2013-12-25 | Disposition: A | Discharge status: Home / Self Care | Payer: 59 | Source: Ambulatory Visit | Attending: Gynecology | Admitting: Gynecology

## 2013-12-25 VITALS — BP 126/78 | Ht 61.5 in | Wt 139.0 lb

## 2013-12-25 DIAGNOSIS — A499 Bacterial infection, unspecified: Secondary | ICD-10-CM

## 2013-12-25 DIAGNOSIS — D251 Intramural leiomyoma of uterus: Secondary | ICD-10-CM | POA: Insufficient documentation

## 2013-12-25 DIAGNOSIS — Z113 Encounter for screening for infections with a predominantly sexual mode of transmission: Secondary | ICD-10-CM

## 2013-12-25 DIAGNOSIS — N898 Other specified noninflammatory disorders of vagina: Secondary | ICD-10-CM

## 2013-12-25 DIAGNOSIS — N920 Excessive and frequent menstruation with regular cycle: Secondary | ICD-10-CM | POA: Insufficient documentation

## 2013-12-25 DIAGNOSIS — B9689 Other specified bacterial agents as the cause of diseases classified elsewhere: Secondary | ICD-10-CM

## 2013-12-25 DIAGNOSIS — Z1151 Encounter for screening for human papillomavirus (HPV): Secondary | ICD-10-CM | POA: Insufficient documentation

## 2013-12-25 DIAGNOSIS — Z01419 Encounter for gynecological examination (general) (routine) without abnormal findings: Secondary | ICD-10-CM | POA: Insufficient documentation

## 2013-12-25 DIAGNOSIS — N760 Acute vaginitis: Secondary | ICD-10-CM

## 2013-12-25 DIAGNOSIS — Z1231 Encounter for screening mammogram for malignant neoplasm of breast: Secondary | ICD-10-CM

## 2013-12-25 DIAGNOSIS — N907 Vulvar cyst: Secondary | ICD-10-CM

## 2013-12-25 DIAGNOSIS — N9089 Other specified noninflammatory disorders of vulva and perineum: Secondary | ICD-10-CM

## 2013-12-25 LAB — CHOLESTEROL, TOTAL: CHOLESTEROL: 208 mg/dL — AB (ref 0–200)

## 2013-12-25 LAB — COMPREHENSIVE METABOLIC PANEL
ALBUMIN: 4.1 g/dL (ref 3.5–5.2)
ALT: 14 U/L (ref 0–35)
AST: 17 U/L (ref 0–37)
Alkaline Phosphatase: 72 U/L (ref 39–117)
BUN: 6 mg/dL (ref 6–23)
CALCIUM: 9.3 mg/dL (ref 8.4–10.5)
CHLORIDE: 104 meq/L (ref 96–112)
CO2: 24 mEq/L (ref 19–32)
Creat: 0.49 mg/dL — ABNORMAL LOW (ref 0.50–1.10)
Glucose, Bld: 82 mg/dL (ref 70–99)
POTASSIUM: 4.3 meq/L (ref 3.5–5.3)
SODIUM: 140 meq/L (ref 135–145)
TOTAL PROTEIN: 6.8 g/dL (ref 6.0–8.3)
Total Bilirubin: 0.4 mg/dL (ref 0.2–1.2)

## 2013-12-25 LAB — CBC WITH DIFFERENTIAL/PLATELET
Basophils Absolute: 0 10*3/uL (ref 0.0–0.1)
Basophils Relative: 0 % (ref 0–1)
Eosinophils Absolute: 0.1 10*3/uL (ref 0.0–0.7)
Eosinophils Relative: 1 % (ref 0–5)
HEMATOCRIT: 38.2 % (ref 36.0–46.0)
HEMOGLOBIN: 12.9 g/dL (ref 12.0–15.0)
LYMPHS ABS: 2.7 10*3/uL (ref 0.7–4.0)
Lymphocytes Relative: 34 % (ref 12–46)
MCH: 26.4 pg (ref 26.0–34.0)
MCHC: 33.8 g/dL (ref 30.0–36.0)
MCV: 78.1 fL (ref 78.0–100.0)
MONOS PCT: 5 % (ref 3–12)
Monocytes Absolute: 0.4 10*3/uL (ref 0.1–1.0)
NEUTROS ABS: 4.8 10*3/uL (ref 1.7–7.7)
NEUTROS PCT: 60 % (ref 43–77)
Platelets: 205 10*3/uL (ref 150–400)
RBC: 4.89 MIL/uL (ref 3.87–5.11)
RDW: 15 % (ref 11.5–15.5)
WBC: 8 10*3/uL (ref 4.0–10.5)

## 2013-12-25 LAB — WET PREP FOR TRICH, YEAST, CLUE
Trich, Wet Prep: NONE SEEN
Yeast Wet Prep HPF POC: NONE SEEN

## 2013-12-25 LAB — TSH: TSH: 2.581 u[IU]/mL (ref 0.350–4.500)

## 2013-12-25 MED ORDER — CLINDAMYCIN HCL 300 MG PO CAPS: ORAL_CAPSULE | ORAL | Status: AC

## 2013-12-25 NOTE — Patient Instructions (Signed)
Bacterial Vaginosis Bacterial vaginosis is a vaginal infection that occurs when the normal balance of bacteria in the vagina is disrupted. It results from an overgrowth of certain bacteria. This is the most common vaginal infection in women of childbearing age. Treatment is important to prevent complications, especially in pregnant women, as it can cause a premature delivery. CAUSES  Bacterial vaginosis is caused by an increase in harmful bacteria that are normally present in smaller amounts in the vagina. Several different kinds of bacteria can cause bacterial vaginosis. However, the reason that the condition develops is not fully understood. RISK FACTORS Certain activities or behaviors can put you at an increased risk of developing bacterial vaginosis, including:  Having a new sex partner or multiple sex partners.  Douching.  Using an intrauterine device (IUD) for contraception. Women do not get bacterial vaginosis from toilet seats, bedding, swimming pools, or contact with objects around them. SIGNS AND SYMPTOMS  Some women with bacterial vaginosis have no signs or symptoms. Common symptoms include:  Grey vaginal discharge.  A fishlike odor with discharge, especially after sexual intercourse.  Itching or burning of the vagina and vulva.  Burning or pain with urination. DIAGNOSIS  Your health care provider will take a medical history and examine the vagina for signs of bacterial vaginosis. A sample of vaginal fluid may be taken. Your health care provider will look at this sample under a microscope to check for bacteria and abnormal cells. A vaginal pH test may also be done.  TREATMENT  Bacterial vaginosis may be treated with antibiotic medicines. These may be given in the form of a pill or a vaginal cream. A second round of antibiotics may be prescribed if the condition comes back after treatment.  HOME CARE INSTRUCTIONS   Only take over-the-counter or prescription medicines as  directed by your health care provider.  If antibiotic medicine was prescribed, take it as directed. Make sure you finish it even if you start to feel better.  Do not have sex until treatment is completed.  Tell all sexual partners that you have a vaginal infection. They should see their health care provider and be treated if they have problems, such as a mild rash or itching.  Practice safe sex by using condoms and only having one sex partner. SEEK MEDICAL CARE IF:   Your symptoms are not improving after 3 days of treatment.  You have increased discharge or pain.  You have a fever. MAKE SURE YOU:   Understand these instructions.  Will watch your condition.  Will get help right away if you are not doing well or get worse. FOR MORE INFORMATION  Centers for Disease Control and Prevention, Division of STD Prevention: www.cdc.gov/std American Sexual Health Association (ASHA): www.ashastd.org  Document Released: 02/07/2005 Document Revised: 11/28/2012 Document Reviewed: 09/19/2012 Exit[employer] Patient Information 2015 [employer]. This information is not intended to replace advice given to you by your health care provider. Make sure you discuss any questions you have with your health care provider.  

## 2013-12-25 NOTE — Progress Notes (Signed)
Veronica Morse 06-29-1965 [id number]   History:    48 y.o.  for annual gyn exam who states that for several months her cycles are lasting sometimes up to 14 days.patient was followed in the past for a left ovarian cyst. Her past history as follows:  In 2013 patient had a miscarriage and was treated with Cytotec and spontaneously aborted. Quantitative beta-hCGs were followed down to 0. See previous note. Patient interested in getting pregnant. Currently not using any form of contraception.  Her ultrasound done in April 2014 demonstrated the following: Uterus measured 10 x 7.1 x 5.6 cm with endometrial stripe 11.5 mm, patient with 3 small fibroids the largest one measured 20 x 25 x 21 mm. Both ovaries appeared to be normal.  She does not recall ever having had an abnormal Pap smear. She's from each up there may be slight language barrier. She has not had a baseline mammogram. She has several family history with diabetes.  Patient with past history of female circumcision  Past medical history,surgical history, family history and social history were all reviewed and documented in the EPIC chart.  Gynecologic History Patient's last menstrual period was 12/04/2013. Contraception: none Last Pap: ?Marland Kitchen Results were: ? Last mammogram: no prior study. Results were: no prior study  Obstetric History OB History  Gravida Para Term Preterm AB SAB TAB Ectopic Multiple Living  1 0   1 1    0    # Outcome Date GA Lbr Len/2nd Weight Sex Delivery Anes PTL Lv  1 SAB                ROS: A ROS was performed and pertinent positives and negatives are included in the history.  GENERAL: No fevers or chills. HEENT: No change in vision, no earache, sore throat or sinus congestion. NECK: No pain or stiffness. CARDIOVASCULAR: No chest pain or pressure. No palpitations. PULMONARY: No shortness of breath, cough or wheeze. GASTROINTESTINAL: No abdominal pain, nausea, vomiting or diarrhea, melena or bright red  blood per rectum. GENITOURINARY: No urinary frequency, urgency, hesitancy or dysuria. MUSCULOSKELETAL: No joint or muscle pain, no back pain, no recent trauma. DERMATOLOGIC: No rash, no itching, no lesions. ENDOCRINE: No polyuria, polydipsia, no heat or cold intolerance. No recent change in weight. HEMATOLOGICAL: No anemia or easy bruising or bleeding. NEUROLOGIC: No headache, seizures, numbness, tingling or weakness. PSYCHIATRIC: No depression, no loss of interest in normal activity or change in sleep pattern.     Exam: chaperone present  BP 126/78 mmHg  Ht 5' 1.5" (1.562 m)  Wt 139 lb (63.05 kg)  BMI 25.84 kg/m2  LMP 12/04/2013  Body mass index is 25.84 kg/(m^2).  General appearance : Well developed well nourished female. No acute distress HEENT: Neck supple, trachea midline, no carotid bruits, no thyroidmegaly Lungs: Clear to auscultation, no rhonchi or wheezes, or rib retractions  Heart: Regular rate and rhythm, no murmurs or gallops Breast:Examined in sitting and supine position were symmetrical in appearance, no palpable masses or tenderness,  no skin retraction, no nipple inversion, no nipple discharge, no skin discoloration, no axillary or supraclavicular lymphadenopathy Abdomen: no palpable masses or tenderness, no rebound or guarding Extremities: no edema or skin discoloration or tenderness  Pelvic:  Large left labial skin tag  Bartholin, Urethra, Skene Glands: Within normal limits             Vagina: No gross lesions or discharge, thick white discharge  Cervix: No gross lesions or discharge  Uterus  anteverted, normal size, shape and consistency, non-tender and mobile  Adnexa  Without masses or tenderness  Anus and perineum  normal   Rectovaginal  normal sphincter tone without palpated masses or tenderness             Hemoccult not indicated   GC and Chlamydia culture obtained results pending at time of this dictation  Wet prep: Amine positive, many clue cells, too  numerous to count WBC too numerous to count bacteria  Assessment/Plan:  48 y.o. female for annual exam will be scheduled to return back to the office next week for sonohysterogram because of patient's heavy menstrual cycles and prior history of miscarriage. She does have history of fibroid uterus but the son hysterogram will be done to rule out any intracavitary submucous myoma contribute to her bleeding and her past miscarriage. She is advancement maternal age and nose that her likelihood of conceiving a very low. For bacterial vaginosis she will be prescribed clindamycin 300 mg one by mouth twice a day for 7 days. GC and Chlamydia culture obtained. Patient will return back later date to have her large left labial skin tag removed. Pap smear with HPV screen was obtained today.the following labs were ordered: Comprehensive metabolic panel, CBC, TSH, screening cholesterol, and urinalysis.   Terrance Mass MD, 5:33 PM 12/25/2013

## 2013-12-26 ENCOUNTER — Other Ambulatory Visit: Payer: Self-pay | Admitting: *Deleted

## 2013-12-26 DIAGNOSIS — E7800 Pure hypercholesterolemia, unspecified: Secondary | ICD-10-CM

## 2013-12-26 LAB — URINALYSIS W MICROSCOPIC + REFLEX CULTURE
Bilirubin Urine: NEGATIVE
CRYSTALS: NONE SEEN
Casts: NONE SEEN
Glucose, UA: NEGATIVE mg/dL
Hgb urine dipstick: NEGATIVE
KETONES UR: NEGATIVE mg/dL
NITRITE: NEGATIVE
Protein, ur: NEGATIVE mg/dL
SPECIFIC GRAVITY, URINE: 1.008 (ref 1.005–1.030)
UROBILINOGEN UA: 0.2 mg/dL (ref 0.0–1.0)
pH: 7.5 (ref 5.0–8.0)

## 2013-12-26 LAB — GC/CHLAMYDIA PROBE AMP
CT PROBE, AMP APTIMA: NEGATIVE
GC PROBE AMP APTIMA: NEGATIVE

## 2013-12-26 LAB — CYTOLOGY - PAP

## 2013-12-27 LAB — URINE CULTURE

## 2013-12-30 ENCOUNTER — Other Ambulatory Visit: Payer: 59

## 2013-12-30 ENCOUNTER — Other Ambulatory Visit: Payer: Self-pay | Admitting: Gynecology

## 2013-12-30 DIAGNOSIS — N920 Excessive and frequent menstruation with regular cycle: Secondary | ICD-10-CM

## 2013-12-30 DIAGNOSIS — E7800 Pure hypercholesterolemia, unspecified: Secondary | ICD-10-CM

## 2013-12-30 DIAGNOSIS — D251 Intramural leiomyoma of uterus: Secondary | ICD-10-CM

## 2013-12-30 LAB — LIPID PANEL
CHOL/HDL RATIO: 3.7 ratio
Cholesterol: 206 mg/dL — ABNORMAL HIGH (ref 0–200)
HDL: 56 mg/dL (ref >39)
LDL CALC: 135 mg/dL — AB (ref 0–99)
Triglycerides: 77 mg/dL (ref <150)
VLDL: 15 mg/dL (ref 0–40)

## 2014-01-08 ENCOUNTER — Ambulatory Visit: Payer: 59 | Admitting: Gynecology

## 2014-01-08 ENCOUNTER — Other Ambulatory Visit: Payer: 59

## 2014-01-13 ENCOUNTER — Ambulatory Visit
Admission: RE | Admit: 2014-01-13 | Discharge: 2014-01-13 | Disposition: A | Discharge status: Home / Self Care | Payer: 59 | Source: Ambulatory Visit

## 2014-01-13 DIAGNOSIS — Z1231 Encounter for screening mammogram for malignant neoplasm of breast: Secondary | ICD-10-CM

## 2014-01-21 ENCOUNTER — Other Ambulatory Visit: Payer: Self-pay | Admitting: Gynecology

## 2014-01-21 DIAGNOSIS — R928 Other abnormal and inconclusive findings on diagnostic imaging of breast: Secondary | ICD-10-CM

## 2014-01-30 ENCOUNTER — Other Ambulatory Visit: Payer: 59

## 2014-01-30 ENCOUNTER — Ambulatory Visit: Payer: 59 | Admitting: Gynecology

## 2014-02-26 ENCOUNTER — Inpatient Hospital Stay: Admission: RE | Admit: 2014-02-26 | Payer: 59 | Source: Ambulatory Visit

## 2014-12-12 ENCOUNTER — Ambulatory Visit
Admission: RE | Admit: 2014-12-12 | Discharge: 2014-12-12 | Disposition: A | Discharge status: Home / Self Care | Payer: 59 | Source: Ambulatory Visit | Attending: Gynecology | Admitting: Gynecology

## 2014-12-12 ENCOUNTER — Other Ambulatory Visit: Payer: Self-pay | Admitting: Gynecology

## 2014-12-12 DIAGNOSIS — R928 Other abnormal and inconclusive findings on diagnostic imaging of breast: Secondary | ICD-10-CM

## 2016-03-08 IMAGING — MG MM DIAG BREAST TOMO BILATERAL
4 series · 4 of 12 positions shown · non-contrast
Comparison: Previous baseline screening mammogram dated 01/13/2014.

CLINICAL DATA: Patient returns today to evaluate a possible mass
identified within the right breast on screening mammogram of
01/13/2014.

EXAM:
DIGITAL DIAGNOSTIC RIGHT MAMMOGRAM WITH 3D TOMOSYNTHESIS WITH CAD
ULTRASOUND RIGHT BREAST

[R MLO]
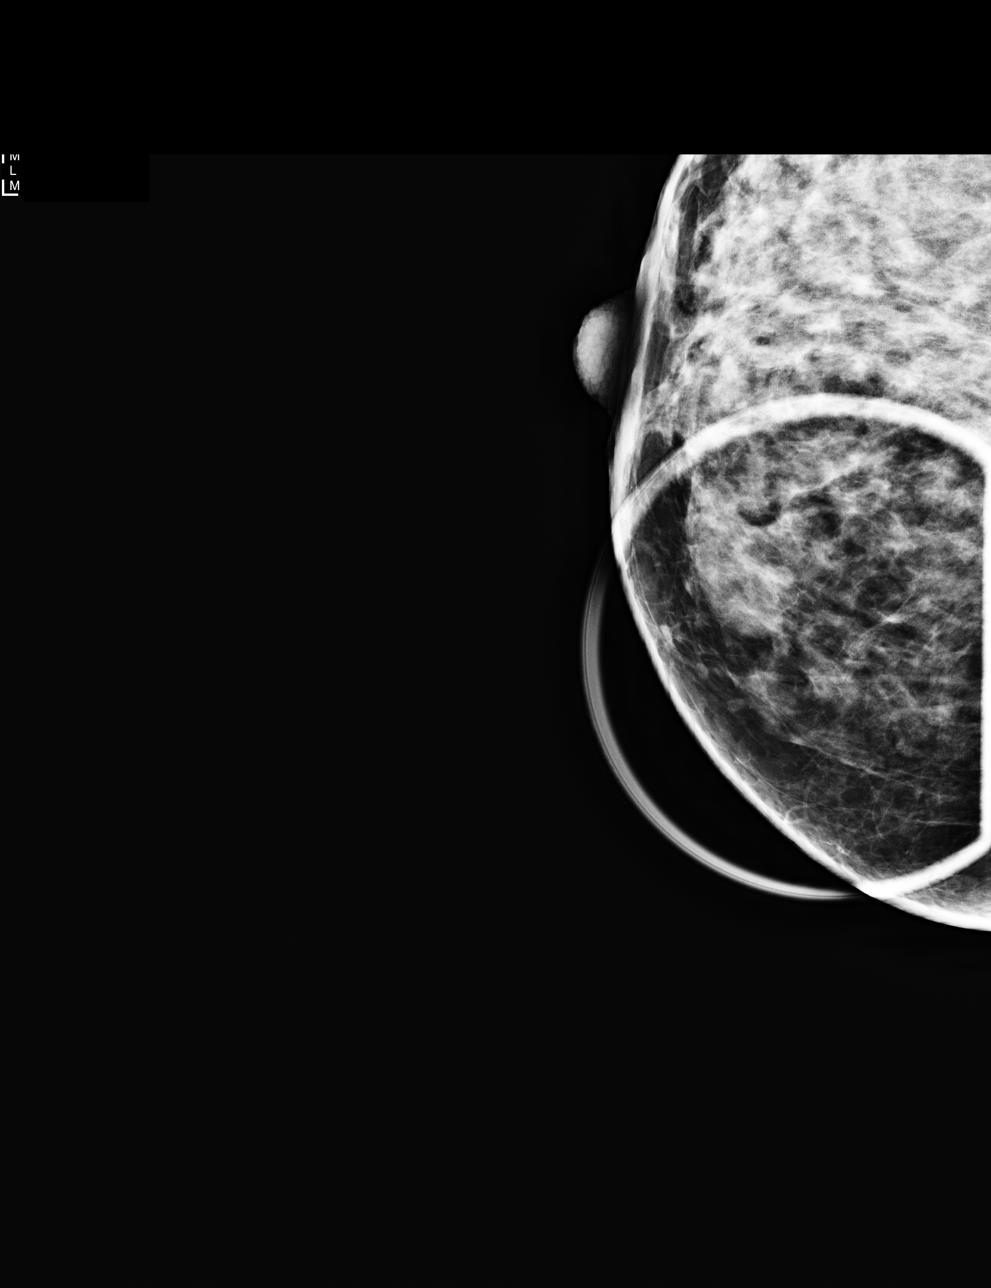

[R CC]
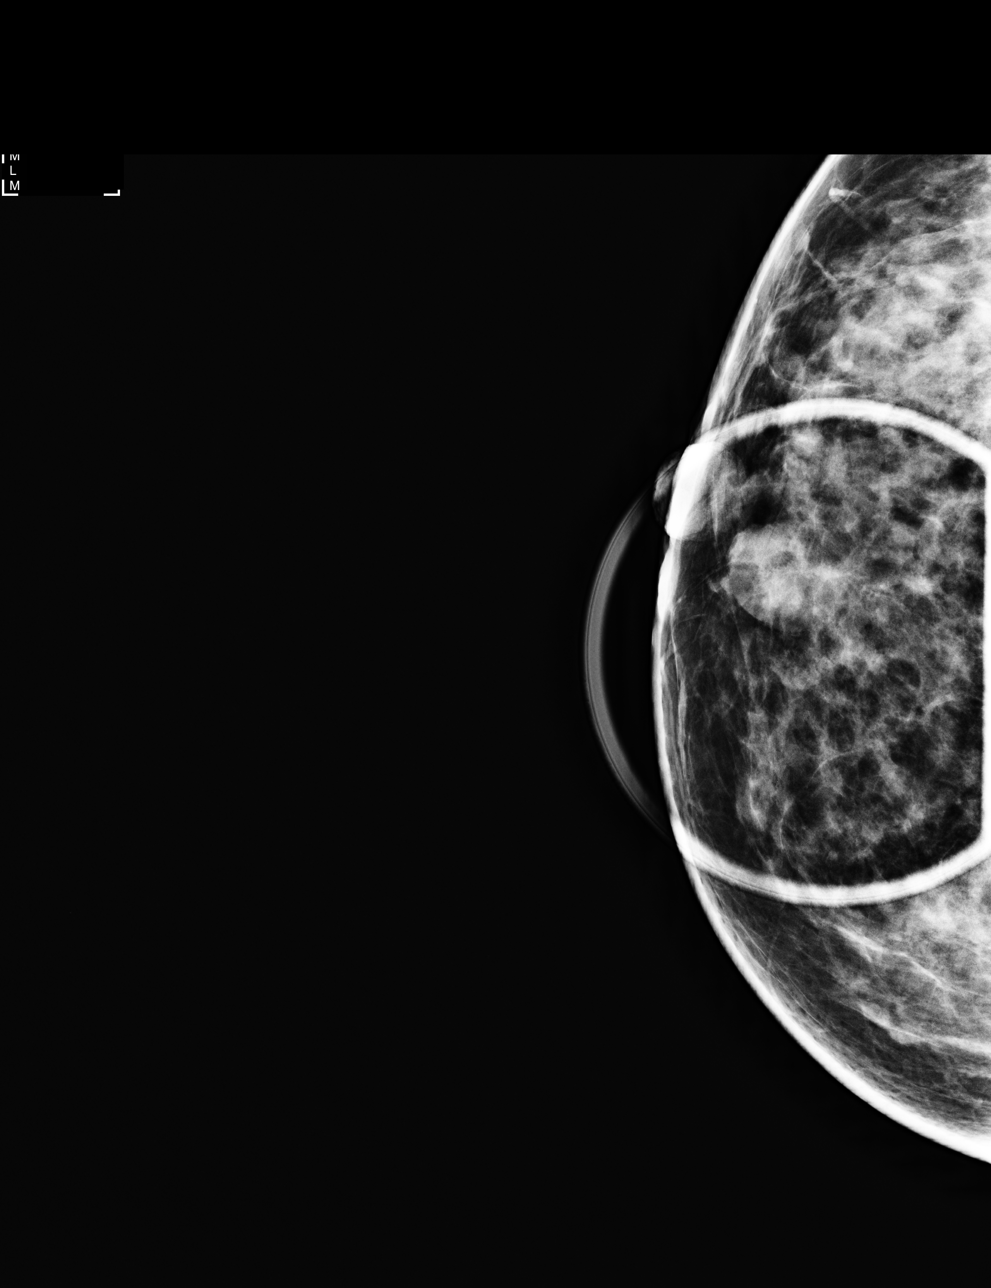

[R CC tomo · tomo slice 23/46.0]
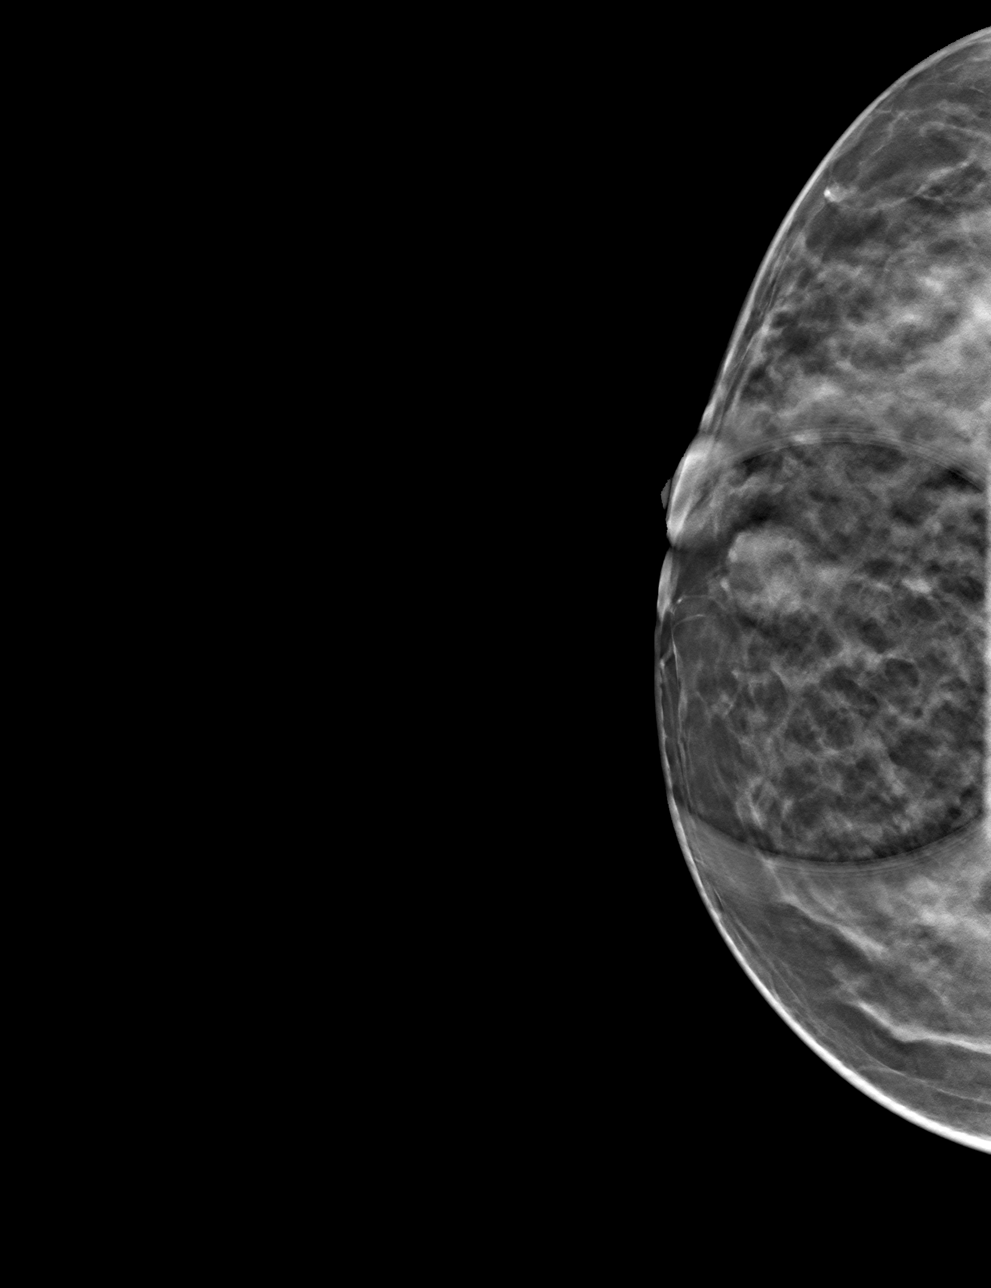

[R MLO tomo · tomo slice 25/48.0]
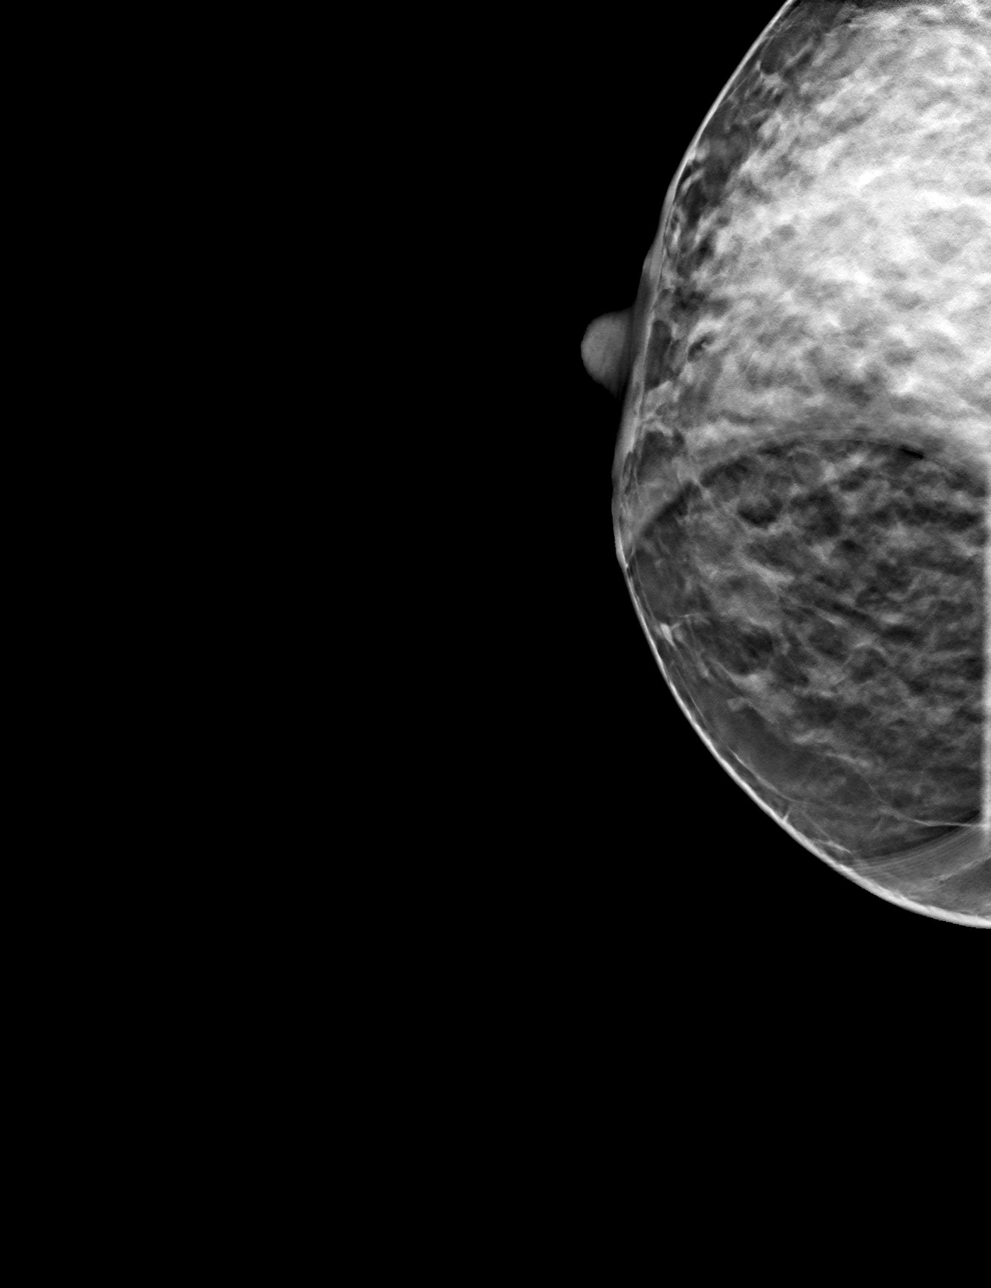

[4 of 12 positions shown; findings below may reference images not displayed]

ACR Breast Density Category c: The breast tissue is heterogeneously
dense, which may obscure small masses.
FINDINGS: On today's additional views with tomosynthesis and spot compression,
there is an oval circumscribed mass confirmed within the lower inner
quadrant of the right breast measuring approximately 1.6 cm greatest
dimension.

Mammographic images were processed with CAD.

On physical exam, there is vague soft tissue thickening within the
lower inner quadrant without circumscribed mass.

Targeted ultrasound is performed, showing an oval circumscribed
hypoechoic mass within the right breast at the 5 o'clock axis, 2 cm
from the nipple, measuring 1.7 x 1 x 1.4 cm, corresponding to the
mammographic finding. The mass has scattered internal cystic foci
and internal vascularity.
IMPRESSION: Probably benign fibroadenoma within the right breast at the 5
o'clock axis, 2 cm from the nipple, measuring 1.7 x 1 x 1.4 cm.

Follow-up right breast diagnostic mammogram and ultrasound is needed
in 6 months to ensure stability.

RECOMMENDATION:
Right breast diagnostic mammogram and ultrasound in 6 months.

The option of ultrasound-guided core needle biopsy was also
discussed with the patient but not recommended at this time. She is
currently comfortable with the 6 month followup ultrasound.

Per our records, patient is due for routine left breast screening
mammogram in 1 month. Patient is aware.

I have discussed the findings and recommendations with the patient.
Results were also provided in writing at the conclusion of the
visit. If applicable, a reminder letter will be sent to the patient
regarding the next appointment.

BI-RADS CATEGORY  3: Probably benign.

## 2016-07-06 ENCOUNTER — Encounter: Payer: Self-pay | Admitting: Gynecology
